# Patient Record
Sex: Male | Born: 2000 | Race: Black or African American | Hispanic: No | Marital: Single | Smoking: Never smoker
Health system: Southern US, Community
[De-identification: ages and names within clinical notes are randomized; demographics above are authoritative.]

## PROBLEM LIST (undated history)

## (undated) DIAGNOSIS — S83512A Sprain of anterior cruciate ligament of left knee, initial encounter: Secondary | ICD-10-CM

## (undated) DIAGNOSIS — J45909 Unspecified asthma, uncomplicated: Secondary | ICD-10-CM

## (undated) DIAGNOSIS — S83207A Unspecified tear of unspecified meniscus, current injury, left knee, initial encounter: Secondary | ICD-10-CM

---
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
## 2020-03-28 ENCOUNTER — Emergency Department (HOSPITAL_BASED_OUTPATIENT_CLINIC_OR_DEPARTMENT_OTHER)
Admission: EM | Admit: 2020-03-28 | Discharge: 2020-03-28 | Disposition: A | Discharge status: Home / Self Care | Payer: Medicaid Other | Attending: Emergency Medicine | Admitting: Emergency Medicine

## 2020-03-28 ENCOUNTER — Other Ambulatory Visit: Payer: Self-pay

## 2020-03-28 ENCOUNTER — Emergency Department (HOSPITAL_BASED_OUTPATIENT_CLINIC_OR_DEPARTMENT_OTHER): Payer: Medicaid Other

## 2020-03-28 ENCOUNTER — Encounter (HOSPITAL_BASED_OUTPATIENT_CLINIC_OR_DEPARTMENT_OTHER): Payer: Self-pay | Admitting: Emergency Medicine

## 2020-03-28 DIAGNOSIS — J45909 Unspecified asthma, uncomplicated: Secondary | ICD-10-CM | POA: Insufficient documentation

## 2020-03-28 DIAGNOSIS — Y9361 Activity, american tackle football: Secondary | ICD-10-CM | POA: Diagnosis not present

## 2020-03-28 DIAGNOSIS — Y929 Unspecified place or not applicable: Secondary | ICD-10-CM | POA: Insufficient documentation

## 2020-03-28 DIAGNOSIS — S4992XA Unspecified injury of left shoulder and upper arm, initial encounter: Secondary | ICD-10-CM

## 2020-03-28 DIAGNOSIS — Y998 Other external cause status: Secondary | ICD-10-CM | POA: Diagnosis not present

## 2020-03-28 DIAGNOSIS — W51XXXA Accidental striking against or bumped into by another person, initial encounter: Secondary | ICD-10-CM | POA: Insufficient documentation

## 2020-03-28 NOTE — ED Triage Notes (Signed)
He was playing football yesterday and injured L shoulder. He is wearing a sling.

## 2020-03-28 NOTE — ED Provider Notes (Signed)
[facility] EMERGENCY DEPARTMENT Provider Note   CSN: [id number] Arrival date & time: 03/28/20  1140     History Chief Complaint  Patient presents with  . Shoulder Injury    Jesse Ramirez is a 20 y.o. male.  HPI       Jesse Ramirez is a 19 y.o. male, with a history of asthma, presenting to the ED with left shoulder injury that occurred yesterday. Patient states yesterday at football he was tackled from the right, landed on the lateral left shoulder, and someone landed on top of him. He is complaining of pain to the left anterior and superior shoulder, aching, moderate, nonradiating.   Increased pain with movement of the left shoulder, especially with trying to raise his arm above his head. Denies head injury, neck pain, back pain, numbness, present weakness, chest pain, shortness of breath, or any other complaints or injuries. Patient and his mother state they have a connection with Rodolph Bong with [facility]  Past Medical History:  Diagnosis Date  . Asthma    exercise induced  . Tears of meniscus and ACL of left knee     Patient Active Problem List   Diagnosis Date Noted  . Tears of meniscus and ACL of left knee   . Asthma   . Left knee injury 09/14/2015  . Injury of right index finger 06/12/2015    Past Surgical History:  Procedure Laterality Date  . KNEE ARTHROSCOPY WITH ANTERIOR CRUCIATE LIGAMENT (ACL) REPAIR WITH HAMSTRING GRAFT Left 09/29/2015   Procedure: LEFT KNEE ARTHROSCOPY WITH LATERAL MENISECTOMY, ANTERIOR CRUCIATE LIGAMENT (ACL) REPAIR WITH HAMSTRING AUTOGRAFT;  Surgeon: Salvatore Marvel, MD;  Location: [facility];  Service: Orthopedics;  Laterality: Left;       Family History  Problem Relation Age of Onset  . Hypertension Other   . Asthma Other     Social History   Tobacco Use  . Smoking status: Never Smoker  . Smokeless tobacco: Never Used  Substance Use Topics  . Alcohol use: No    Alcohol/week: 0.0 standard  drinks  . Drug use: No    Home Medications Prior to Admission medications   Medication Sig Start Date End Date Taking? Authorizing Provider  albuterol (PROVENTIL) (2.5 MG/3ML) 0.083% nebulizer solution Take 2.5 mg by nebulization every 6 (six) hours as needed for wheezing or shortness of breath.    [provider]  baclofen (LIORESAL) 10 MG tablet Take 10 mg every 8 (eight) hours by mouth.    [provider]  diazepam (VALIUM) 2 MG tablet Take 1 tablet (2 mg total) by mouth every 8 (eight) hours as needed for muscle spasms. Patient not taking: Reported on 11/03/2017 09/29/15   Shepperson, Kirstin, PA-C  fluticasone (FLONASE) 50 MCG/ACT nasal spray Place 2 sprays into both nostrils at bedtime. Patient not taking: Reported on 11/03/2017 08/31/15   Ozella Rocks, MD  ipratropium (ATROVENT) 0.06 % nasal spray Place 2 sprays into both nostrils 4 (four) times daily. Patient not taking: Reported on 11/03/2017 08/31/15   Ozella Rocks, MD  oxyCODONE (ROXICODONE) 5 MG immediate release tablet 1-2 tablets every 4-6 hrs as needed for pain Patient not taking: Reported on 12/18/2017 09/29/15   Shepperson, Kirstin, PA-C    Allergies    Augmentin [amoxicillin-pot clavulanate] and Keflex [cephalexin]  Review of Systems   Review of Systems  Musculoskeletal: Positive for arthralgias.  Neurological: Negative for weakness and numbness.    Physical Exam Updated Vital Signs BP 137/77 (BP  Location: Left Arm)   Pulse 68   Temp 98.6 F (37 C) (Oral)   Resp 16   Ht 5\' 11"  (1.803 m)   Wt 108.9 kg   SpO2 100%   BMI 33.47 kg/m   Physical Exam Vitals and nursing note reviewed.  Constitutional:      General: He is not in acute distress.    Appearance: He is well-developed. He is not diaphoretic.  HENT:     Head: Normocephalic and atraumatic.  Eyes:     Conjunctiva/sclera: Conjunctivae normal.  Cardiovascular:     Rate and Rhythm: Normal rate and regular rhythm.     Pulses:           Radial pulses are 2+ on the right side and 2+ on the left side.  Pulmonary:     Effort: Pulmonary effort is normal.  Musculoskeletal:     Cervical back: Neck supple.     Comments: Tenderness to the anterior and superior left shoulder without deformity, swelling, instability, or color change.  Pain with range of motion of the left shoulder.  He is able to touch the left hand to the right shoulder.  His range of motion is limited to 90 degrees abduction of the shoulder. No tenderness or other abnormality to the posterior shoulder, upper back, or left arm.  No tenderness, swelling, or deformity over the clavicle. No pain with range of motion of the neck. Normal motor function intact in all extremities. No midline spinal tenderness.   Skin:    General: Skin is warm and dry.     Capillary Refill: Capillary refill takes less than 2 seconds.     Coloration: Skin is not pale.  Neurological:     Mental Status: He is alert.     Comments: Sensation to light touch grossly intact throughout the bilateral upper extremities. Grip strength equal bilaterally. Strength 5/5 in the biceps/triceps. He does have some decreased strength with the empty can test, but can hold his arm out in front of him without support.  Psychiatric:        Behavior: Behavior normal.     ED Results / Procedures / Treatments   Labs (all labs ordered are listed, but only abnormal results are displayed) Labs Reviewed - No data to display  EKG None  Radiology DG Shoulder Left  Result Date: 03/28/2020 CLINICAL DATA:  [place] while playing football yesterday. Superior left shoulder pain. EXAM: LEFT SHOULDER - 2+ VIEW COMPARISON:  None. FINDINGS: There is no evidence of fracture or dislocation. There is no evidence of arthropathy or other focal bone abnormality. Soft tissues are unremarkable. IMPRESSION: Negative. Electronically Signed   By: Lajean Manes M.D.   On: 03/28/2020 12:14    Procedures Procedures (including critical  care time)  Medications Ordered in ED Medications - No data to display  ED Course  I have reviewed the triage vital signs and the nursing notes.  Pertinent labs & imaging results that were available during my care of the patient were reviewed by me and considered in my medical decision making (see chart for details).    MDM Rules/Calculators/A&P                      Patient presents with injury to left shoulder.  Exam findings do not suggest neurovascular compromise. No acute abnormality on x-ray. I personally reviewed and interpreted the patient's x-rays. Patient in a sling and orthopedic follow-up recommended. The patient was given instructions for  home care as well as return precautions. Patient voices understanding of these instructions, accepts the plan, and is comfortable with discharge.    Final Clinical Impression(s) / ED Diagnoses Final diagnoses:  Injury of left shoulder, initial encounter    Rx / DC Orders ED Discharge Orders    None       Concepcion Living 03/28/20 1604    Rolan Bucco, MD 03/29/20 [phone]

## 2020-03-28 NOTE — Discharge Instructions (Signed)
You have been seen today for a shoulder injury. There were no acute abnormalities on the x-rays, including no sign of fracture or dislocation, however, there could be injuries to the soft tissues, such as the ligaments or tendons that are not seen on xrays. There could also be what are called occult fractures that are small fractures not seen on xray. Antiinflammatory medications: Take 600 mg of ibuprofen every 6 hours or 440 mg (over the counter dose) to 500 mg (prescription dose) of naproxen every 12 hours for the next 3 days. After this time, these medications may be used as needed for pain. Take these medications with food to avoid upset stomach. Choose only one of these medications, do not take them together. Acetaminophen (generic for Tylenol): Should you continue to have additional pain while taking the ibuprofen or naproxen, you may add in acetaminophen as needed. Your daily total maximum amount of acetaminophen from all sources should be limited to 4000mg /day for persons without liver problems, or 2000mg /day for those with liver problems. Ice: May apply ice to the area over the next 24 hours for 15 minutes at a time to reduce swelling. Elevation: Keep the extremity elevated as often as possible to reduce pain and inflammation. Support: Wear the sling for support and comfort. Wear this until pain resolves.  Exercises: Be sure to move the shoulder around gently multiple times throughout the day to avoid buildup of stiffness in the shoulder. Follow up: Follow-up with the orthopedic specialist on this manner.  Follow-up would ideally occur within the next 2 weeks.  Call to make an appointment. Return: Return to the ED for numbness, weakness, increasing pain, overall worsening symptoms, loss of function, or if symptoms are not improving, you have tried to follow up with the orthopedic specialist, and have been unable to do so.  For prescription assistance, may try using prescription discount sites or  apps, such as goodrx.com

## 2021-08-19 ENCOUNTER — Emergency Department (HOSPITAL_BASED_OUTPATIENT_CLINIC_OR_DEPARTMENT_OTHER)
Admission: EM | Admit: 2021-08-19 | Discharge: 2021-08-19 | Disposition: A | Discharge status: Home / Self Care | Payer: Medicaid Other | Attending: Emergency Medicine | Admitting: Emergency Medicine

## 2021-08-19 ENCOUNTER — Other Ambulatory Visit: Payer: Self-pay

## 2021-08-19 ENCOUNTER — Encounter (HOSPITAL_BASED_OUTPATIENT_CLINIC_OR_DEPARTMENT_OTHER): Payer: Self-pay

## 2021-08-19 DIAGNOSIS — R300 Dysuria: Secondary | ICD-10-CM | POA: Insufficient documentation

## 2021-08-19 DIAGNOSIS — Z711 Person with feared health complaint in whom no diagnosis is made: Secondary | ICD-10-CM

## 2021-08-19 DIAGNOSIS — J45909 Unspecified asthma, uncomplicated: Secondary | ICD-10-CM | POA: Insufficient documentation

## 2021-08-19 DIAGNOSIS — R369 Urethral discharge, unspecified: Secondary | ICD-10-CM | POA: Diagnosis not present

## 2021-08-19 LAB — URINALYSIS, ROUTINE W REFLEX MICROSCOPIC
Bilirubin Urine: NEGATIVE
Glucose, UA: NEGATIVE mg/dL
Ketones, ur: NEGATIVE mg/dL
Nitrite: NEGATIVE
Protein, ur: NEGATIVE mg/dL
Specific Gravity, Urine: 1.015 (ref 1.005–1.030)
pH: 7 (ref 5.0–8.0)

## 2021-08-19 LAB — URINALYSIS, MICROSCOPIC (REFLEX): WBC, UA: >50 WBC/hpf (ref 0–5)

## 2021-08-19 MED ORDER — ONDANSETRON 4 MG PO TBDP
4.0000 mg | ORAL_TABLET | Freq: Once | ORAL | Status: AC
  Administered 2021-08-19: 4 mg via ORAL
  Filled 2021-08-19: qty 1

## 2021-08-19 MED ORDER — METRONIDAZOLE 500 MG PO TABS
2000.0000 mg | ORAL_TABLET | Freq: Once | ORAL | Status: AC
  Administered 2021-08-19: 2000 mg via ORAL
  Filled 2021-08-19: qty 4

## 2021-08-19 MED ORDER — LIDOCAINE HCL (PF) 1 % IJ SOLN
INTRAMUSCULAR | Status: AC
  Administered 2021-08-19: 5 mL
  Filled 2021-08-19: qty 5

## 2021-08-19 MED ORDER — CEFTRIAXONE SODIUM 500 MG IJ SOLR
500.0000 mg | Freq: Once | INTRAMUSCULAR | Status: AC
  Administered 2021-08-19: 500 mg via INTRAMUSCULAR
  Filled 2021-08-19: qty 500

## 2021-08-19 MED ORDER — DOXYCYCLINE HYCLATE 100 MG PO CAPS: 100.0000 mg | ORAL_CAPSULE | Freq: Two times a day (BID) | ORAL | 0 refills | Status: AC

## 2021-08-19 NOTE — ED Provider Notes (Signed)
[facility] EMERGENCY DEPARTMENT Provider Note   CSN: [id number] Arrival date & time: 08/19/21  1453     History Chief Complaint  Patient presents with   Penile Discharge    Jesse Ramirez is a 20 y.o. male who presents to the ED Today with complaint of milky white discharge for the past 2-3 days. Pt also complains of dysuria and penile swelling. No testicular pain. Pt is sexually active with 1 male partner and they do not use protection. He denies being told he was exposed to any STIs. He denies hx of same. He does not want HIV or RPR testing at this time. Denies fevers, chills, nausea, vomiting, testicular pain/swelling.   The history is provided by the patient and medical records.      Past Medical History:  Diagnosis Date   Asthma    exercise induced   Tears of meniscus and ACL of left knee     Patient Active Problem List   Diagnosis Date Noted   Tears of meniscus and ACL of left knee    Asthma    Left knee injury 09/14/2015   Injury of right index finger 06/12/2015    Past Surgical History:  Procedure Laterality Date   KNEE ARTHROSCOPY WITH ANTERIOR CRUCIATE LIGAMENT (ACL) REPAIR WITH HAMSTRING GRAFT Left 09/29/2015   Procedure: LEFT KNEE ARTHROSCOPY WITH LATERAL MENISECTOMY, ANTERIOR CRUCIATE LIGAMENT (ACL) REPAIR WITH HAMSTRING AUTOGRAFT;  Surgeon: Salvatore Marvel, MD;  Location: [facility];  Service: Orthopedics;  Laterality: Left;       Family History  Problem Relation Age of Onset   Hypertension Other    Asthma Other     Social History   Tobacco Use   Smoking status: Never   Smokeless tobacco: Never  Vaping Use   Vaping Use: Never used  Substance Use Topics   Alcohol use: Yes    Comment: occ   Drug use: Yes    Types: Marijuana    Home Medications Prior to Admission medications   Medication Sig Start Date End Date Taking? Authorizing Provider  doxycycline (VIBRAMYCIN) 100 MG capsule Take 1 capsule (100 mg total) by  mouth 2 (two) times daily for 7 days. 08/19/21 08/26/21 Yes Miki Labuda, PA-C  albuterol (PROVENTIL) (2.5 MG/3ML) 0.083% nebulizer solution Take 2.5 mg by nebulization every 6 (six) hours as needed for wheezing or shortness of breath.    [provider]  baclofen (LIORESAL) 10 MG tablet Take 10 mg every 8 (eight) hours by mouth.    [provider]  diazepam (VALIUM) 2 MG tablet Take 1 tablet (2 mg total) by mouth every 8 (eight) hours as needed for muscle spasms. Patient not taking: Reported on 11/03/2017 09/29/15   Shepperson, Kirstin, PA-C  fluticasone (FLONASE) 50 MCG/ACT nasal spray Place 2 sprays into both nostrils at bedtime. Patient not taking: Reported on 11/03/2017 08/31/15   Ozella Rocks, MD  ipratropium (ATROVENT) 0.06 % nasal spray Place 2 sprays into both nostrils 4 (four) times daily. Patient not taking: Reported on 11/03/2017 08/31/15   Ozella Rocks, MD  oxyCODONE (ROXICODONE) 5 MG immediate release tablet 1-2 tablets every 4-6 hrs as needed for pain Patient not taking: Reported on 12/18/2017 09/29/15   Shepperson, Kirstin, PA-C    Allergies    Augmentin [amoxicillin-pot clavulanate] and Keflex [cephalexin]  Review of Systems   Review of Systems  Constitutional:  Negative for chills and fever.  Gastrointestinal:  Negative for nausea and vomiting.  Genitourinary:  Positive  for dysuria, penile discharge and penile swelling. Negative for frequency, genital sores, scrotal swelling and testicular pain.  All other systems reviewed and are negative.  Physical Exam Updated Vital Signs BP (!) 158/95 (BP Location: Left Arm)   Pulse (!) 102   Temp 98.5 F (36.9 C) (Oral)   Resp 16   Ht 6' (1.829 m)   Wt 105.7 kg   SpO2 100%   BMI 31.60 kg/m   Physical Exam Vitals and nursing note reviewed.  Constitutional:      Appearance: He is not ill-appearing.  HENT:     Head: Normocephalic and atraumatic.  Eyes:     Conjunctiva/sclera: Conjunctivae normal.   Cardiovascular:     Rate and Rhythm: Normal rate and regular rhythm.  Pulmonary:     Effort: Pulmonary effort is normal.     Breath sounds: Normal breath sounds.  Genitourinary:    Comments: Chaperone present for exam Kristin Bruins RN Circumcised penis with cloudy white discharge without phimosis/paraphimosis, hypospadias, erythema, tenderness. No rashes or lesions. Testes with no masses or tenderness, no swelling, and cremasterics reflex present bilaterally. No abnormal lie. No inguinal hernias or adenopathy present. Skin:    General: Skin is warm and dry.     Coloration: Skin is not jaundiced.  Neurological:     Mental Status: He is alert.    ED Results / Procedures / Treatments   Labs (all labs ordered are listed, but only abnormal results are displayed) Labs Reviewed  URINALYSIS, ROUTINE W REFLEX MICROSCOPIC - Abnormal; Notable for the following components:      Result Value   APPearance CLOUDY (*)    Hgb urine dipstick MODERATE (*)    Leukocytes,Ua LARGE (*)    All other components within normal limits  URINALYSIS, MICROSCOPIC (REFLEX) - Abnormal; Notable for the following components:   Bacteria, UA FEW (*)    All other components within normal limits  GC/CHLAMYDIA PROBE AMP ([place]) NOT AT [facility]    EKG None  Radiology No results found.  Procedures Procedures   Medications Ordered in ED Medications  ondansetron (ZOFRAN-ODT) disintegrating tablet 4 mg (4 mg Oral Given 08/19/21 1637)  cefTRIAXone (ROCEPHIN) injection 500 mg (500 mg Intramuscular Given 08/19/21 1630)  metroNIDAZOLE (FLAGYL) tablet 2,000 mg (2,000 mg Oral Given 08/19/21 1630)  lidocaine (PF) (XYLOCAINE) 1 % injection (5 mLs  Given 08/19/21 1637)    ED Course  I have reviewed the triage vital signs and the nursing notes.  Pertinent labs & imaging results that were available during my care of the patient were reviewed by me and considered in my medical decision making (see chart for  details).    MDM Rules/Calculators/A&P                           20 year old male presenting to the ED today with complaint of penile discharge and dysuria. Sexually active with 1 male partner; does not use protection. No known exposure however. On arrival to the ED VSS and pt appears to be in NAD. Chaperone present for GU exam - cloudy white discharge to penis without lesions appreciated. No testicular TTP on exam and no complaint of same. Will test for GC chlamydia today. Pt does not want HIV or RPR testing. Given he is symptomatic will treat empirically. Pt reports "nausea" with keflex. Given he denies severe allergic reaction to same will provide ODT zofran prior to IM rocephin.   U/A with  large leuks, 6-10 RBCs, < 50 WBCs, and WBC clumps. Given he is also having penile discharge I am more suspicious for STD and doubt UTI at this time given he is an otherwise healthy young male.   Pt has been monitored in the ED after rocephin without adverse reaction. Stable for discharge at this time with Rx doxycycline. Pt instructed to refrain from intercourse until he finishes his abx. He is instructed that all of his partners need to be tested and treated. Pt in agreement with plan and stable for discharge home.   This note was prepared using Dragon voice recognition software and may include unintentional dictation errors due to the inherent limitations of voice recognition software.   Final Clinical Impression(s) / ED Diagnoses Final diagnoses:  Penile discharge  Concern about STD in male without diagnosis    Rx / DC Orders ED Discharge Orders          Ordered    doxycycline (VIBRAMYCIN) 100 MG capsule  2 times daily        08/19/21 1700             Discharge Instructions      Please pick up medications and take as prescribed to cover for possible chlamydia infection.   You will need to let all partners know they need to be tested and treated as well  Refrain from intercourse  for the next 2 weeks  Return to the ED for any new/worsening symptoms       Tanda Rockers, PA-C 08/19/21 1701    Charlynne Pander, MD 08/25/21 2124

## 2021-08-19 NOTE — ED Triage Notes (Signed)
Pt c/o penile d/c and dysuria x 3 days-NAD-steady gait 

## 2021-08-19 NOTE — Discharge Instructions (Addendum)
Please pick up medications and take as prescribed to cover for possible chlamydia infection.   You will need to let all partners know they need to be tested and treated as well  Refrain from intercourse for the next 2 weeks  Return to the ED for any new/worsening symptoms

## 2021-08-20 LAB — GC/CHLAMYDIA PROBE AMP (~~LOC~~) NOT AT ARMC
Chlamydia: POSITIVE — AB
Comment: NEGATIVE
Comment: NORMAL
Neisseria Gonorrhea: POSITIVE — AB

## 2022-05-25 ENCOUNTER — Encounter (HOSPITAL_BASED_OUTPATIENT_CLINIC_OR_DEPARTMENT_OTHER): Payer: Self-pay

## 2022-05-25 ENCOUNTER — Emergency Department (HOSPITAL_BASED_OUTPATIENT_CLINIC_OR_DEPARTMENT_OTHER)
Admission: EM | Admit: 2022-05-25 | Discharge: 2022-05-25 | Disposition: A | Discharge status: Home / Self Care | Payer: Medicaid Other | Attending: Emergency Medicine | Admitting: Emergency Medicine

## 2022-05-25 ENCOUNTER — Other Ambulatory Visit: Payer: Self-pay

## 2022-05-25 ENCOUNTER — Emergency Department (HOSPITAL_BASED_OUTPATIENT_CLINIC_OR_DEPARTMENT_OTHER): Payer: Medicaid Other

## 2022-05-25 DIAGNOSIS — S99912A Unspecified injury of left ankle, initial encounter: Secondary | ICD-10-CM | POA: Diagnosis present

## 2022-05-25 DIAGNOSIS — M25572 Pain in left ankle and joints of left foot: Secondary | ICD-10-CM | POA: Insufficient documentation

## 2022-05-25 DIAGNOSIS — X501XXA Overexertion from prolonged static or awkward postures, initial encounter: Secondary | ICD-10-CM | POA: Diagnosis not present

## 2022-05-25 DIAGNOSIS — Y9367 Activity, basketball: Secondary | ICD-10-CM | POA: Insufficient documentation

## 2022-05-25 DIAGNOSIS — S93402A Sprain of unspecified ligament of left ankle, initial encounter: Secondary | ICD-10-CM | POA: Diagnosis not present

## 2022-05-25 NOTE — ED Triage Notes (Signed)
Pt presents with a L ankle injury after "rolling it" playing basketball today.

## 2022-05-25 NOTE — Discharge Instructions (Signed)
You were seen in the emergency department for ankle pain.  Your x-ray showed no broken bones. You likely sprained your ankle. We've placed this in a wrap that you can continue to use at home.   You can take ibuprofen as needed for pain. I've attached the contact information for the orthopedic doctor for you to call and make an appointment if your symptoms don't improve within the week.

## 2022-05-25 NOTE — ED Notes (Signed)
Patient transported to X-ray 

## 2022-05-25 NOTE — ED Provider Notes (Signed)
[facility] EMERGENCY DEPARTMENT Provider Note   CSN: [id number] Arrival date & time: 05/25/22  1348     History  Chief Complaint  Patient presents with   Ankle Pain    Jesse Ramirez is a 21 y.o. male who presents the emergency department complaining of left ankle pain.  Patient states that while he was playing basketball yesterday he stepped on another person's foot on accident and "rolled" his ankle.  He has been having pain ever since.  He took some ibuprofen with some relief.  He states his pain is made worse with bearing weight or plantar flexing his foot.  No other trauma noted.  No numbness.   Ankle Pain     Home Medications Prior to Admission medications   Medication Sig Start Date End Date Taking? Authorizing Provider  albuterol (PROVENTIL) (2.5 MG/3ML) 0.083% nebulizer solution Take 2.5 mg by nebulization every 6 (six) hours as needed for wheezing or shortness of breath.    [provider]  baclofen (LIORESAL) 10 MG tablet Take 10 mg every 8 (eight) hours by mouth.    [provider]  diazepam (VALIUM) 2 MG tablet Take 1 tablet (2 mg total) by mouth every 8 (eight) hours as needed for muscle spasms. Patient not taking: Reported on 11/03/2017 09/29/15   Shepperson, Kirstin, PA-C  fluticasone (FLONASE) 50 MCG/ACT nasal spray Place 2 sprays into both nostrils at bedtime. Patient not taking: Reported on 11/03/2017 08/31/15   Ozella Rocks, MD  ipratropium (ATROVENT) 0.06 % nasal spray Place 2 sprays into both nostrils 4 (four) times daily. Patient not taking: Reported on 11/03/2017 08/31/15   Ozella Rocks, MD  oxyCODONE (ROXICODONE) 5 MG immediate release tablet 1-2 tablets every 4-6 hrs as needed for pain Patient not taking: Reported on 12/18/2017 09/29/15   Shepperson, Kirstin, PA-C      Allergies    Augmentin [amoxicillin-pot clavulanate] and Keflex [cephalexin]    Review of Systems   Review of Systems  Musculoskeletal:  Positive for  arthralgias.  All other systems reviewed and are negative.  Physical Exam Updated Vital Signs BP (!) 146/91 (BP Location: Left Arm)   Pulse 76   Temp 98 F (36.7 C) (Oral)   Resp 18   Ht 5\' 10"  (1.778 m)   Wt 102.1 kg   SpO2 100%   BMI 32.28 kg/m  Physical Exam Vitals and nursing note reviewed.  Constitutional:      Appearance: Normal appearance.  HENT:     Head: Normocephalic and atraumatic.  Eyes:     Conjunctiva/sclera: Conjunctivae normal.  Cardiovascular:     Pulses:          Posterior tibial pulses are 2+ on the right side and 2+ on the left side.  Pulmonary:     Effort: Pulmonary effort is normal. No respiratory distress.  Musculoskeletal:     Comments: Some soft tissue swelling of the left ankle with no overlying skin changes.  Sensation intact.  No obvious deformity.  Skin:    General: Skin is warm and dry.  Neurological:     Mental Status: He is alert.  Psychiatric:        Mood and Affect: Mood normal.        Behavior: Behavior normal.    ED Results / Procedures / Treatments   Labs (all labs ordered are listed, but only abnormal results are displayed) Labs Reviewed - No data to display  EKG None  Radiology DG Ankle Complete Left  Result Date: 05/25/2022 CLINICAL DATA:  Left ankle injury after rolling it playing basketball today. EXAM: LEFT ANKLE COMPLETE - 3+ VIEW COMPARISON:  None Available. FINDINGS: Normal bone mineralization. Joint spaces are preserved. The ankle mortise is symmetric and intact. No acute fracture is seen. No dislocation. No soft tissue swelling. IMPRESSION: Normal left ankle radiographs. Electronically Signed   By: Neita Garnet M.D.   On: 05/25/2022 14:53    Procedures Procedures    Medications Ordered in ED Medications - No data to display  ED Course/ Medical Decision Making/ A&P                           Medical Decision Making Amount and/or Complexity of Data Reviewed Radiology: ordered.   This patient is a 21 y.o.  male  who presents to the ED for concern of left ankle pain.   Past Medical History / Co-morbidities: Asthma  Physical Exam: Physical exam performed. The pertinent findings include: No obvious deformity of the left ankle except for diffuse soft tissue swelling.  Neurovascularly intact in bilateral lower extremities.  Lab Tests/Imaging studies: I Ordered, and personally interpreted labs/imaging and the pertinent results include: Left ankle x-ray showed no acute fractures or dislocations. I agree with the radiologist interpretation.   Disposition: After consideration of the diagnostic results and the patients response to treatment, I feel that patient's not requiring admission.  He likely sustained an ankle sprain.  We have placed this in an Ace wrap.  I recommend over-the-counter medications, and orthopedic follow-up if his symptoms do not improve. Discussed reasons to return to the emergency department, and the patient is agreeable to the plan.         Final Clinical Impression(s) / ED Diagnoses Final diagnoses:  Sprain of left ankle, unspecified ligament, initial encounter    Rx / DC Orders ED Discharge Orders     None      Portions of this report may have been transcribed using voice recognition software. Every effort was made to ensure accuracy; however, inadvertent computerized transcription errors may be present.    Jeanella Flattery 05/25/22 1530    Jacalyn Lefevre, MD 05/26/22 [phone]
